# Patient Record
Sex: Male | Born: 2000 | Race: White | Hispanic: Yes | Marital: Single | Smoking: Never smoker
Health system: Southern US, Community
[De-identification: ages and names within clinical notes are randomized; demographics above are authoritative.]

---
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
## 2022-01-17 ENCOUNTER — Other Ambulatory Visit: Payer: Self-pay

## 2022-01-17 ENCOUNTER — Emergency Department (HOSPITAL_COMMUNITY)
Admission: EM | Admit: 2022-01-17 | Discharge: 2022-01-17 | Disposition: A | Discharge status: Home / Self Care | Payer: Medicaid Other | Attending: Emergency Medicine | Admitting: Emergency Medicine

## 2022-01-17 DIAGNOSIS — S8991XA Unspecified injury of right lower leg, initial encounter: Secondary | ICD-10-CM | POA: Insufficient documentation
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
## 2022-04-13 DIAGNOSIS — Z114 Encounter for screening for human immunodeficiency virus [HIV]: Secondary | ICD-10-CM | POA: Diagnosis not present

## 2022-04-13 DIAGNOSIS — Z0000 Encounter for general adult medical examination without abnormal findings: Secondary | ICD-10-CM | POA: Diagnosis not present

## 2022-04-13 DIAGNOSIS — Z113 Encounter for screening for infections with a predominantly sexual mode of transmission: Secondary | ICD-10-CM | POA: Diagnosis not present

## 2022-04-13 DIAGNOSIS — Z131 Encounter for screening for diabetes mellitus: Secondary | ICD-10-CM | POA: Diagnosis not present

## 2022-04-13 DIAGNOSIS — Z13220 Encounter for screening for lipoid disorders: Secondary | ICD-10-CM | POA: Diagnosis not present

## 2022-04-13 DIAGNOSIS — Z1159 Encounter for screening for other viral diseases: Secondary | ICD-10-CM | POA: Diagnosis not present

## 2022-04-13 DIAGNOSIS — D229 Melanocytic nevi, unspecified: Secondary | ICD-10-CM | POA: Diagnosis not present

## 2022-04-13 DIAGNOSIS — Z7182 Exercise counseling: Secondary | ICD-10-CM | POA: Diagnosis not present

## 2022-04-13 DIAGNOSIS — Z713 Dietary counseling and surveillance: Secondary | ICD-10-CM | POA: Diagnosis not present

## 2022-08-12 ENCOUNTER — Encounter (HOSPITAL_COMMUNITY): Payer: Self-pay

## 2022-08-12 ENCOUNTER — Other Ambulatory Visit: Payer: Self-pay

## 2022-08-12 ENCOUNTER — Emergency Department (HOSPITAL_COMMUNITY): Payer: Medicaid Other

## 2022-08-12 ENCOUNTER — Emergency Department (HOSPITAL_COMMUNITY)
Admission: EM | Admit: 2022-08-12 | Discharge: 2022-08-12 | Disposition: A | Discharge status: Home / Self Care | Payer: Medicaid Other | Attending: Emergency Medicine | Admitting: Emergency Medicine

## 2022-08-12 DIAGNOSIS — G4453 Primary thunderclap headache: Secondary | ICD-10-CM

## 2022-08-12 DIAGNOSIS — R519 Headache, unspecified: Secondary | ICD-10-CM | POA: Diagnosis not present

## 2022-08-12 LAB — CBC WITH DIFFERENTIAL/PLATELET
Abs Immature Granulocytes: 0.02 10*3/uL (ref 0.00–0.07)
Basophils Absolute: 0 10*3/uL (ref 0.0–0.1)
Basophils Relative: 0 %
Eosinophils Absolute: 0.1 10*3/uL (ref 0.0–0.5)
Eosinophils Relative: 1 %
HCT: 48.1 % (ref 39.0–52.0)
Hemoglobin: 16.7 g/dL (ref 13.0–17.0)
Immature Granulocytes: 0 %
Lymphocytes Relative: 17 %
Lymphs Abs: 1.2 10*3/uL (ref 0.7–4.0)
MCH: 31.7 pg (ref 26.0–34.0)
MCHC: 34.7 g/dL (ref 30.0–36.0)
MCV: 91.3 fL (ref 80.0–100.0)
Monocytes Absolute: 0.8 10*3/uL (ref 0.1–1.0)
Monocytes Relative: 11 %
Neutro Abs: 5 10*3/uL (ref 1.7–7.7)
Neutrophils Relative %: 71 %
Platelets: 169 10*3/uL (ref 150–400)
RBC: 5.27 MIL/uL (ref 4.22–5.81)
RDW: 11.9 % (ref 11.5–15.5)
WBC: 7 10*3/uL (ref 4.0–10.5)
nRBC: 0 % (ref 0.0–0.2)

## 2022-08-12 LAB — BASIC METABOLIC PANEL
Anion gap: 10 (ref 5–15)
BUN: 16 mg/dL (ref 6–20)
CO2: 27 mmol/L (ref 22–32)
Calcium: 9.5 mg/dL (ref 8.9–10.3)
Chloride: 101 mmol/L (ref 98–111)
Creatinine, Ser: 1.12 mg/dL (ref 0.61–1.24)
GFR, Estimated: >60 mL/min (ref >60)
Glucose, Bld: 92 mg/dL (ref 70–99)
Potassium: 3.8 mmol/L (ref 3.5–5.1)
Sodium: 138 mmol/L (ref 135–145)

## 2022-08-12 MED ORDER — IOHEXOL 350 MG/ML SOLN
75.0000 mL | Freq: Once | INTRAVENOUS | Status: AC | PRN
  Administered 2022-08-12: 75 mL via INTRAVENOUS

## 2022-08-12 MED ORDER — ACETAMINOPHEN 500 MG PO TABS
1000.0000 mg | ORAL_TABLET | Freq: Once | ORAL | Status: AC
  Administered 2022-08-12: 1000 mg via ORAL
  Filled 2022-08-12: qty 2

## 2022-08-12 MED ORDER — SODIUM CHLORIDE 0.9 % IV SOLN: INTRAVENOUS | Status: DC

## 2022-08-12 MED ORDER — SODIUM CHLORIDE 0.9 % IV BOLUS
1000.0000 mL | Freq: Once | INTRAVENOUS | Status: AC
  Administered 2022-08-12: 1000 mL via INTRAVENOUS

## 2022-08-12 MED ORDER — METOCLOPRAMIDE HCL 5 MG/ML IJ SOLN
10.0000 mg | Freq: Once | INTRAMUSCULAR | Status: AC
  Administered 2022-08-12: 10 mg via INTRAVENOUS
  Filled 2022-08-12: qty 2

## 2022-08-12 MED ORDER — DIPHENHYDRAMINE HCL 50 MG/ML IJ SOLN
12.5000 mg | Freq: Once | INTRAMUSCULAR | Status: AC
  Administered 2022-08-12: 12.5 mg via INTRAVENOUS
  Filled 2022-08-12: qty 1

## 2022-08-12 NOTE — Discharge Instructions (Addendum)
Your presentation of thunderclap headache while working out is concerning for potential subarachnoid hemorrhage.  Your CT angiogram was negative which is a good test for detecting a potential aneurysm.  However, we discussed how CT is not a good test greater than 6 hours to rule out acute bleeding.  We discussed the risks and benefits of lumbar puncture to fully rule out subarachnoid hemorrhage in the setting of your presentation today.  After discussion, you have elected to leave AGAINST MEDICAL ADVICE.  Recommend you closely follow-up with your PCP to ensure resolution and return for any severe worsening symptoms.

## 2022-08-12 NOTE — ED Provider Notes (Signed)
[place] EMERGENCY DEPARTMENT AT [facility] Provider Note   CSN: [id number] Arrival date & time: 08/12/22  1336     History  Chief Complaint  Patient presents with   Headache    Donald Rivas is a 22 y.o. male.   Headache    22 year old male with no significant medical history presents to the Emergency Department with sudden onset thunderclap headache.  The patient states that he was lifting weights and doing leg presses when he experienced a sudden onset maximal onset headache located in the occiput radiating to his right temple.  He has had intermittent headache over the past 3 days that worsened today when he tried to exert himself again.  He denies any facial droop, numbness, weakness, double vision, blurry vision.  He denies any fevers, chills or neck stiffness.  Denies any nausea or vomiting.  No light sensitivity or sound sensitivity.  Home Medications Prior to Admission medications   Not on File      Allergies    Patient has no known allergies.    Review of Systems   Review of Systems  Neurological:  Positive for headaches.  All other systems reviewed and are negative.   Physical Exam Updated Vital Signs BP 105/64   Pulse 80   Temp 98.4 F (36.9 C)   Resp 18   Ht 5\' 7"  (1.702 m)   Wt 63.5 kg   SpO2 100%   BMI 21.93 kg/m  Physical Exam Vitals and nursing note reviewed.  Constitutional:      General: He is not in acute distress.    Appearance: He is well-developed.  HENT:     Head: Normocephalic and atraumatic.  Eyes:     Conjunctiva/sclera: Conjunctivae normal.  Neck:     Comments: No meningismus Cardiovascular:     Rate and Rhythm: Normal rate and regular rhythm.     Heart sounds: No murmur heard. Pulmonary:     Effort: Pulmonary effort is normal. No respiratory distress.     Breath sounds: Normal breath sounds.  Abdominal:     Palpations: Abdomen is soft.     Tenderness: There is no abdominal tenderness.   Musculoskeletal:        General: No swelling.     Cervical back: Neck supple.  Skin:    General: Skin is warm and dry.     Capillary Refill: Capillary refill takes less than 2 seconds.  Neurological:     Mental Status: He is alert.  Psychiatric:        Mood and Affect: Mood normal.     ED Results / Procedures / Treatments   Labs (all labs ordered are listed, but only abnormal results are displayed) Labs Reviewed  BASIC METABOLIC PANEL  CBC WITH DIFFERENTIAL/PLATELET    EKG None  Radiology CT ANGIO HEAD NECK W WO CM  Result Date: 08/12/2022 CLINICAL DATA:  Thunderclap headache EXAM: CT ANGIOGRAPHY HEAD AND NECK WITH AND WITHOUT CONTRAST TECHNIQUE: Multidetector CT imaging of the head and neck was performed using the standard protocol during bolus administration of intravenous contrast. Multiplanar CT image reconstructions and MIPs were obtained to evaluate the vascular anatomy. Carotid stenosis measurements (when applicable) are obtained utilizing NASCET criteria, using the distal internal carotid diameter as the denominator. RADIATION DOSE REDUCTION: This exam was performed according to the departmental dose-optimization program which includes automated exposure control, adjustment of the mA and/or kV according to patient size and/or use of iterative reconstruction technique. CONTRAST:  75mL OMNIPAQUE  IOHEXOL 350 MG/ML SOLN COMPARISON:  None Available. FINDINGS: CT HEAD FINDINGS Brain: There is no acute intracranial hemorrhage, extra-axial fluid collection, or acute infarct. Parenchymal volume is normal. The ventricles are normal in size. Gray-white differentiation is preserved The pituitary and suprasellar region are normal. There is no mass lesion. There is no mass effect or midline shift. Vascular: See below. Skull: Normal. Negative for fracture or focal lesion. Sinuses/Orbits: The paranasal sinuses are clear. The globes and orbits are unremarkable. Other: The mastoid air cells and  middle ear cavities are clear. Review of the MIP images confirms the above findings CTA NECK FINDINGS Aortic arch: The imaged aortic arch is normal. The origins of the major branch vessels are patent. The subclavian arteries are patent to the level imaged. Right carotid system: The right common, internal, and external carotid arteries are patent, without hemodynamically significant stenosis or occlusion there is no evidence of dissection or aneurysm. Left carotid system: The left common, internal, and external carotid arteries are patent, without hemodynamically significant stenosis or occlusion. There is no evidence of dissection or aneurysm. Vertebral arteries: The vertebral arteries are patent, without hemodynamically significant stenosis or occlusion. There is no evidence of dissection or aneurysm. Skeleton: There is no acute osseous abnormality or suspicious osseous lesion. There is no visible canal hematoma. Other neck: The soft tissues of the neck are unremarkable. Upper chest: The imaged lung apices are clear. Review of the MIP images confirms the above findings CTA HEAD FINDINGS Anterior circulation: The intracranial ICAs are normal. The bilateral MCAs and ACAS are normal. The anterior communicating artery is normal There is no aneurysm or AVM. Posterior circulation: The bilateral V4 segments are normal. The basilar artery is normal. The major cerebellar arteries appear normal. The bilateral PCAs are normal. There is no aneurysm or AVM. Bilateral PCAs are normal. Small bilateral posterior communicating arteries are identified. There is no aneurysm or AVM. Venous sinuses: Patent. Anatomic variants: None. Review of the MIP images confirms the above findings IMPRESSION: 1. Normal noncontrast head CT. 2. Normal CTA of the head and neck. Electronically Signed   By: Lesia Hausen M.D.   On: 08/12/2022 19:58    Procedures Procedures    Medications Ordered in ED Medications  sodium chloride 0.9 % bolus 1,000  mL (1,000 mLs Intravenous New Bag/Given 08/12/22 1737)    And  0.9 %  sodium chloride infusion (has no administration in time range)  metoCLOPramide (REGLAN) injection 10 mg (10 mg Intravenous Given 08/12/22 1737)  diphenhydrAMINE (BENADRYL) injection 12.5 mg (12.5 mg Intravenous Given 08/12/22 1737)  acetaminophen (TYLENOL) tablet 1,000 mg (1,000 mg Oral Given 08/12/22 1737)  iohexol (OMNIPAQUE) 350 MG/ML injection 75 mL (75 mLs Intravenous Contrast Given 08/12/22 1918)    ED Course/ Medical Decision Making/ A&P                                 Medical Decision Making Amount and/or Complexity of Data Reviewed Labs: ordered. Radiology: ordered.  Risk OTC drugs. Prescription drug management.    22 year old male with no significant medical history presents to the Emergency Department with sudden onset thunderclap headache.  The patient states that he was lifting weights and doing leg presses when he experienced a sudden onset maximal onset headache located in the occiput radiating to his right temple.  He has had intermittent headache over the past 3 days that worsened today when he tried to exert himself again.  He denies any facial droop, numbness, weakness, double vision, blurry vision.  He denies any fevers, chills or neck stiffness.  Denies any nausea or vomiting.  No light sensitivity or sound sensitivity.  Donald Rivas is a 22 y.o. male who presents with thunderclap as per above.   On arrival, Was vitally stable, afebrile, not tachycardic or tachypneic, saturating 91% on room air.  Sinus rhythm noted on cardiac telemetry.  Currently he is awake, alert, GCS 15, HDS, and afebrile. His exam is most notable for normal gait, fully intact extraocular motions with bilaterally reactive pupils, no focal neurologic deficits, no meningismus, and no temporal tenderness. There is no rash.   The patient describes a primary thunderclap headache that occurred with exertional activity.  No prior  history of migraine headaches.  I discussed with the patient the differential diagnosis which included subarachnoid hemorrhage.  Discussed testing modalities which included lumbar puncture as the gold standard versus CT angiogram head and neck.  The patient would prefer to undergo CT angiogram initially and reengage regarding discussion of further invasive testing.  IV access was obtained and the patient was administered IV fluid bolus, migraine cocktail.  I am most concerned for [facility].  I have low suspicion for meningitis.  The patient is afebrile with no meningismus on exam.  No focal neurologic deficits, low concern for CVT.  To further evaluate and risk stratify him, labs and imaging were obtained, which were significant for:  Labs: CBC and BMP unremarkable Imaging:  CTA Head and Neck: IMPRESSION:  1. Normal noncontrast head CT.  2. Normal CTA of the head and neck.     I counseled the patient regarding the risks and benefits of lumbar puncture in the setting of his sudden onset thunderclap headache while exerting himself 3 days ago.  His CTA was negative.  The patient is currently completely asymptomatic.  After discussion of the risks and benefits, the patient made informed decision to decline lumbar puncture at this time.  I informed the patient of the potential for missed diagnosis in the setting of fully ruling out subarachnoid hemorrhage via lumbar puncture.  The patient understood this and plans to return to the emergency department if he continues to have symptoms.  He would prefer at this time to sign out AGAINST MEDICAL ADVICE.   ED Medication Summary: Medications  sodium chloride 0.9 % bolus 1,000 mL (1,000 mLs Intravenous New Bag/Given 08/12/22 1737)    And  0.9 %  sodium chloride infusion (has no administration in time range)  metoCLOPramide (REGLAN) injection 10 mg (10 mg Intravenous Given 08/12/22 1737)  diphenhydrAMINE (BENADRYL) injection 12.5 mg (12.5 mg Intravenous Given  08/12/22 1737)  acetaminophen (TYLENOL) tablet 1,000 mg (1,000 mg Oral Given 08/12/22 1737)  iohexol (OMNIPAQUE) 350 MG/ML injection 75 mL (75 mLs Intravenous Contrast Given 08/12/22 1918)     Final Clinical Impression(s) / ED Diagnoses Final diagnoses:  Thunderclap headache    Rx / DC Orders ED Discharge Orders     None         Ernie Avena, MD 08/12/22 2015

## 2022-08-12 NOTE — ED Triage Notes (Signed)
Pt reports head pain that started 3 days ago while working out. Denies n/v

## 2022-08-23 DIAGNOSIS — S161XXD Strain of muscle, fascia and tendon at neck level, subsequent encounter: Secondary | ICD-10-CM | POA: Diagnosis not present
